# Patient Record
Sex: Female | Born: 1975 | Race: White | Hispanic: No | Marital: Married | Smoking: Former smoker
Health system: Southern US, Community
[De-identification: ages and names within clinical notes are randomized; demographics above are authoritative.]

## PROBLEM LIST (undated history)

## (undated) DIAGNOSIS — J302 Other seasonal allergic rhinitis: Secondary | ICD-10-CM

## (undated) DIAGNOSIS — M7989 Other specified soft tissue disorders: Secondary | ICD-10-CM

## (undated) DIAGNOSIS — G43909 Migraine, unspecified, not intractable, without status migrainosus: Secondary | ICD-10-CM

## (undated) DIAGNOSIS — R002 Palpitations: Secondary | ICD-10-CM

## (undated) HISTORY — DX: Other specified soft tissue disorders: M79.89

## (undated) HISTORY — DX: Other seasonal allergic rhinitis: J30.2

## (undated) HISTORY — PX: CHOLECYSTECTOMY: SHX55

## (undated) HISTORY — DX: Palpitations: R00.2

---

## 1998-03-05 ENCOUNTER — Other Ambulatory Visit: Admission: RE | Admit: 1998-03-05 | Discharge: 1998-03-05 | Payer: Self-pay | Admitting: Obstetrics and Gynecology

## 1999-03-13 ENCOUNTER — Other Ambulatory Visit: Admission: RE | Admit: 1999-03-13 | Discharge: 1999-03-13 | Payer: Self-pay | Admitting: Obstetrics and Gynecology

## 2000-04-12 ENCOUNTER — Other Ambulatory Visit: Admission: RE | Admit: 2000-04-12 | Discharge: 2000-04-12 | Payer: Self-pay | Admitting: Obstetrics and Gynecology

## 2000-07-05 ENCOUNTER — Other Ambulatory Visit: Admission: RE | Admit: 2000-07-05 | Discharge: 2000-07-05 | Payer: Self-pay | Admitting: Obstetrics and Gynecology

## 2001-02-23 ENCOUNTER — Other Ambulatory Visit: Admission: RE | Admit: 2001-02-23 | Discharge: 2001-02-23 | Payer: Self-pay | Admitting: *Deleted

## 2001-06-16 ENCOUNTER — Other Ambulatory Visit: Admission: RE | Admit: 2001-06-16 | Discharge: 2001-06-16 | Payer: Self-pay | Admitting: Obstetrics and Gynecology

## 2002-07-11 ENCOUNTER — Other Ambulatory Visit: Admission: RE | Admit: 2002-07-11 | Discharge: 2002-07-11 | Payer: Self-pay | Admitting: Obstetrics and Gynecology

## 2002-11-27 ENCOUNTER — Encounter: Payer: Self-pay | Admitting: Emergency Medicine

## 2002-11-27 ENCOUNTER — Ambulatory Visit (HOSPITAL_COMMUNITY): Admission: RE | Admit: 2002-11-27 | Discharge: 2002-11-27 | Payer: Self-pay | Admitting: Emergency Medicine

## 2004-03-24 ENCOUNTER — Other Ambulatory Visit: Admission: RE | Admit: 2004-03-24 | Discharge: 2004-03-24 | Payer: Self-pay | Admitting: Obstetrics and Gynecology

## 2004-04-21 ENCOUNTER — Inpatient Hospital Stay (HOSPITAL_COMMUNITY): Admission: AD | Admit: 2004-04-21 | Discharge: 2004-04-21 | Payer: Self-pay | Admitting: *Deleted

## 2004-09-20 ENCOUNTER — Inpatient Hospital Stay (HOSPITAL_COMMUNITY): Admission: AD | Admit: 2004-09-20 | Discharge: 2004-09-25 | Payer: Self-pay | Admitting: Obstetrics and Gynecology

## 2004-09-26 ENCOUNTER — Encounter: Admission: RE | Admit: 2004-09-26 | Discharge: 2004-10-26 | Payer: Self-pay | Admitting: Obstetrics and Gynecology

## 2004-10-27 ENCOUNTER — Other Ambulatory Visit: Admission: RE | Admit: 2004-10-27 | Discharge: 2004-10-27 | Payer: Self-pay | Admitting: Obstetrics and Gynecology

## 2004-10-28 ENCOUNTER — Emergency Department (HOSPITAL_COMMUNITY): Admission: EM | Admit: 2004-10-28 | Discharge: 2004-10-28 | Payer: Self-pay | Admitting: Emergency Medicine

## 2004-11-05 ENCOUNTER — Inpatient Hospital Stay (HOSPITAL_COMMUNITY): Admission: RE | Admit: 2004-11-05 | Discharge: 2004-11-08 | Payer: Self-pay | Admitting: Surgery

## 2006-01-27 ENCOUNTER — Other Ambulatory Visit: Admission: RE | Admit: 2006-01-27 | Discharge: 2006-01-27 | Payer: Self-pay | Admitting: Obstetrics and Gynecology

## 2006-05-12 ENCOUNTER — Encounter: Admission: RE | Admit: 2006-05-12 | Discharge: 2006-05-12 | Payer: Self-pay | Admitting: Otolaryngology

## 2012-05-10 ENCOUNTER — Emergency Department (HOSPITAL_BASED_OUTPATIENT_CLINIC_OR_DEPARTMENT_OTHER)
Admission: EM | Admit: 2012-05-10 | Discharge: 2012-05-10 | Disposition: A | Discharge status: Home / Self Care | Payer: BC Managed Care – PPO | Attending: Emergency Medicine | Admitting: Emergency Medicine

## 2012-05-10 ENCOUNTER — Encounter (HOSPITAL_BASED_OUTPATIENT_CLINIC_OR_DEPARTMENT_OTHER): Payer: Self-pay

## 2012-05-10 DIAGNOSIS — R5383 Other fatigue: Secondary | ICD-10-CM | POA: Insufficient documentation

## 2012-05-10 DIAGNOSIS — R55 Syncope and collapse: Secondary | ICD-10-CM | POA: Insufficient documentation

## 2012-05-10 DIAGNOSIS — F17200 Nicotine dependence, unspecified, uncomplicated: Secondary | ICD-10-CM | POA: Insufficient documentation

## 2012-05-10 DIAGNOSIS — R531 Weakness: Secondary | ICD-10-CM

## 2012-05-10 DIAGNOSIS — R5381 Other malaise: Secondary | ICD-10-CM | POA: Insufficient documentation

## 2012-05-10 LAB — COMPREHENSIVE METABOLIC PANEL
ALT: 8 U/L (ref 0–35)
AST: 13 U/L (ref 0–37)
Alkaline Phosphatase: 65 U/L (ref 39–117)
CO2: 27 mEq/L (ref 19–32)
GFR calc Af Amer: >90 mL/min (ref >90)
GFR calc non Af Amer: >90 mL/min (ref >90)
Glucose, Bld: 88 mg/dL (ref 70–99)
Potassium: 3.8 mEq/L (ref 3.5–5.1)
Sodium: 139 mEq/L (ref 135–145)
Total Protein: 7.1 g/dL (ref 6.0–8.3)

## 2012-05-10 LAB — CBC
Platelets: 295 10*3/uL (ref 150–400)
RBC: 4.51 MIL/uL (ref 3.87–5.11)
WBC: 7.6 10*3/uL (ref 4.0–10.5)

## 2012-05-10 LAB — DIFFERENTIAL
Basophils Absolute: 0 10*3/uL (ref 0.0–0.1)
Lymphocytes Relative: 25 % (ref 12–46)
Lymphs Abs: 1.9 10*3/uL (ref 0.7–4.0)
Neutrophils Relative %: 67 % (ref 43–77)

## 2012-05-10 LAB — MONONUCLEOSIS SCREEN: Mono Screen: NEGATIVE

## 2012-05-10 LAB — URINALYSIS, ROUTINE W REFLEX MICROSCOPIC
Leukocytes, UA: NEGATIVE
Nitrite: NEGATIVE
Specific Gravity, Urine: 1.006 (ref 1.005–1.030)
pH: 7.5 (ref 5.0–8.0)

## 2012-05-10 LAB — PREGNANCY, URINE: Preg Test, Ur: NEGATIVE

## 2012-05-10 NOTE — Discharge Instructions (Signed)
Fatigue Fatigue is a feeling of tiredness, lack of energy, lack of motivation, or feeling tired all the time. Having enough rest, good nutrition, and reducing stress will normally reduce fatigue. Consult your caregiver if it persists. The nature of your fatigue will help your caregiver to find out its cause. The treatment is based on the cause.  CAUSES  There are many causes for fatigue. Most of the time, fatigue can be traced to one or more of your habits or routines. Most causes fit into one or more of three general areas. They are: Lifestyle problems  Sleep disturbances.   Overwork.   Physical exertion.   Unhealthy habits.   Poor eating habits or eating disorders.   Alcohol and/or drug use .   Lack of proper nutrition (malnutrition).  Psychological problems  Stress and/or anxiety problems.   Depression.   Grief.   Boredom.  Medical Problems or Conditions  Anemia.   Pregnancy.   Thyroid gland problems.   Recovery from major surgery.   Continuous pain.   Emphysema or asthma that is not well controlled   Allergic conditions.   Diabetes.   Infections (such as mononucleosis).   Obesity.   Sleep disorders, such as sleep apnea.   Heart failure or other heart-related problems.   Cancer.   Kidney disease.   Liver disease.   Effects of certain medicines such as antihistamines, cough and cold remedies, prescription pain medicines, heart and blood pressure medicines, drugs used for treatment of cancer, and some antidepressants.  SYMPTOMS  The symptoms of fatigue include:   Lack of energy.   Lack of drive (motivation).   Drowsiness.   Feeling of indifference to the surroundings.  DIAGNOSIS  The details of how you feel help guide your caregiver in finding out what is causing the fatigue. You will be asked about your present and past health condition. It is important to review all medicines that you take, including prescription and non-prescription items. A  thorough exam will be done. You will be questioned about your feelings, habits, and normal lifestyle. Your caregiver may suggest blood tests, urine tests, or other tests to look for common medical causes of fatigue.  TREATMENT  Fatigue is treated by correcting the underlying cause. For example, if you have continuous pain or depression, treating these causes will improve how you feel. Similarly, adjusting the dose of certain medicines will help in reducing fatigue.  HOME CARE INSTRUCTIONS   Try to get the required amount of good sleep every night.   Eat a healthy and nutritious diet, and drink enough water throughout the day.   Practice ways of relaxing (including yoga or meditation).   Exercise regularly.   Make plans to change situations that cause stress. Act on those plans so that stresses decrease over time. Keep your work and personal routine reasonable.   Avoid street drugs and minimize use of alcohol.   Start taking a daily multivitamin after consulting your caregiver.  SEEK MEDICAL CARE IF:   You have persistent tiredness, which cannot be accounted for.   You have fever.   You have unintentional weight loss.   You have headaches.   You have disturbed sleep throughout the night.   You are feeling sad.   You have constipation.   You have dry skin.   You have gained weight.   You are taking any new or different medicines that you suspect are causing fatigue.   You are unable to sleep at night.     You develop any unusual swelling of your legs or other parts of your body.  SEEK IMMEDIATE MEDICAL CARE IF:   You are feeling confused.   Your vision is blurred.   You feel faint or pass out.   You develop severe headache.   You develop severe abdominal, pelvic, or back pain.   You develop chest pain, shortness of breath, or an irregular or fast heartbeat.   You are unable to pass a normal amount of urine.   You develop abnormal bleeding such as bleeding from  the rectum or you vomit blood.   You have thoughts about harming yourself or committing suicide.   You are worried that you might harm someone else.  MAKE SURE YOU:   Understand these instructions.   Will watch your condition.   Will get help right away if you are not doing well or get worse.  Document Released: 10/03/2007 Document Revised: 11/25/2011 Document Reviewed: 10/03/2007 [facility] Patient Information 2012 [place], [place].Near-Syncope Near-syncope is sudden weakness, dizziness, or feeling like you might pass out (faint). This can happen when getting up or while standing for a long time. It can be caused by a drop in blood pressure. It is common in people taking medicine for blood pressure. Fainting can happen when the blood pressure or pulse is too low. HOME CARE  If you feel like you are going to pass out:   Lie down right away.   Breathe deeply and steadily.   Move only when the feeling has gone away. Most of the time, this feeling lasts only a few minutes. You may feel tired for several hours.   Drink enough fluids to keep your pee (urine) clear or pale yellow.   If you are taking blood pressure or heart medicine, stand up slowly.  GET HELP RIGHT AWAY IF:   You have a severe headache.   Unusual pain develops in the chest, belly (abdomen), or back.   You have bleeding from the mouth or butt (rectum), or you have black or tarry poop (stool).   You feel your heart beat differently than normal, or you have a very fast pulse.   You pass out, or you twitch and shake when you pass out.   You pass out when sitting or lying down.   You feel confused.   You have trouble walking.   You are weak.   You have vision problems.  MAKE SURE YOU:   Understand these instructions.   Will watch your condition.   Will get help right away if you are not doing well or get worse.  Document Released: 05/24/2008 Document Revised: 11/25/2011 Document Reviewed:  01/22/2011 [facility] Patient Information 2012 [place], [place].

## 2012-05-10 NOTE — ED Notes (Signed)
Pt states she has had a sore throat since mid April-completed amoxil and is on cefdinir-concerned about episode 4/19 after eating lunch where she felt weak, tired "could just barely go"-episode yesterday at work after lunch where she stood from desk and had ? Syncopal episode-woke on floor-states her co-workers told her she looked jaundiced-c/o right flank pain 1/10-also c/o with mid abd cramps "for months" and craving salt-pt NAD at present

## 2012-05-10 NOTE — ED Provider Notes (Signed)
History     CSN: [id number]  Arrival date & time 05/10/12  1047   First MD Initiated Contact with Patient 05/10/12 1157      Chief Complaint  Patient presents with  . Fatigue    (Consider location/radiation/quality/duration/timing/severity/associated sxs/prior treatment) HPI Comments: Patient states weakness since last month.  Had viral illness she was treated for but never quite seemed to get over.  She became weak and near-syncopal at work today when standing.  Several co-workers remarked that she looked "pale".    Patient is a 36 y.o. female presenting with weakness. The history is provided by the patient.  Weakness Primary symptoms do not include fever, nausea or vomiting. Episode onset: one month ago. The symptoms are worsening. The neurological symptoms are diffuse.  Additional symptoms include weakness.    History reviewed. No pertinent past medical history.  Past Surgical History  Procedure Date  . Cholecystectomy   . Cesarean section     No family history on file.  History  Substance Use Topics  . Smoking status: Current Everyday Smoker  . Smokeless tobacco: Not on file  . Alcohol Use: Yes    OB History    Grav Para Term Preterm Abortions TAB SAB Ect Mult Living                  Review of Systems  Constitutional: Negative for fever.  Gastrointestinal: Negative for nausea and vomiting.  Neurological: Positive for weakness.  All other systems reviewed and are negative.    Allergies  Review of patient's allergies indicates no known allergies.  Home Medications  No current outpatient prescriptions on file.  BP 119/64  Pulse 78  Temp(Src) 98 F (36.7 C) (Oral)  Resp 14  Ht 5\' 3"  (1.6 m)  Wt 168 lb (76.204 kg)  BMI 29.76 kg/m2  SpO2 100%  LMP 04/22/2012  Physical Exam  Nursing note and vitals reviewed. Constitutional: She is oriented to person, place, and time. She appears well-developed and well-nourished. No distress.  HENT:  Head:  Normocephalic and atraumatic.  Neck: Normal range of motion. Neck supple.  Cardiovascular: Normal rate and regular rhythm.  Exam reveals no gallop and no friction rub.   No murmur heard. Pulmonary/Chest: Effort normal and breath sounds normal. No respiratory distress. She has no wheezes.  Abdominal: Soft. Bowel sounds are normal. She exhibits no distension. There is no tenderness.  Musculoskeletal: Normal range of motion.  Neurological: She is alert and oriented to person, place, and time.  Skin: Skin is warm and dry. She is not diaphoretic.    ED Course  Procedures (including critical care time)   Labs Reviewed  URINALYSIS, ROUTINE W REFLEX MICROSCOPIC  PREGNANCY, URINE   No results found.   No diagnosis found.   Date: 05/10/2012  Rate: 67  Rhythm: normal sinus rhythm  QRS Axis: normal  Intervals: normal  ST/T Wave abnormalities: normal  Conduction Disutrbances:none  Narrative Interpretation:   Old EKG Reviewed: unchanged    MDM  The ekg, labs to this point look okay.  The tsh and ebv are pending and won't be complete for another day or two.  The patient will be discharged to home, to return prn.  I am uncertain as to why she is feeling this way, but no emergent cause has been found.  Possibly viral.        Geoffery Lyons, MD 05/10/12 1325

## 2012-05-11 LAB — EPSTEIN-BARR VIRUS VCA ANTIBODY PANEL: EBV NA IgG: 0.21 U/mL

## 2012-06-02 ENCOUNTER — Encounter (HOSPITAL_BASED_OUTPATIENT_CLINIC_OR_DEPARTMENT_OTHER): Payer: Self-pay

## 2012-06-02 ENCOUNTER — Emergency Department (HOSPITAL_BASED_OUTPATIENT_CLINIC_OR_DEPARTMENT_OTHER)
Admission: EM | Admit: 2012-06-02 | Discharge: 2012-06-02 | Disposition: A | Discharge status: Home / Self Care | Payer: BC Managed Care – PPO | Attending: Emergency Medicine | Admitting: Emergency Medicine

## 2012-06-02 ENCOUNTER — Emergency Department (HOSPITAL_BASED_OUTPATIENT_CLINIC_OR_DEPARTMENT_OTHER): Payer: BC Managed Care – PPO

## 2012-06-02 DIAGNOSIS — S161XXA Strain of muscle, fascia and tendon at neck level, initial encounter: Secondary | ICD-10-CM

## 2012-06-02 DIAGNOSIS — Y92410 Unspecified street and highway as the place of occurrence of the external cause: Secondary | ICD-10-CM | POA: Insufficient documentation

## 2012-06-02 DIAGNOSIS — S139XXA Sprain of joints and ligaments of unspecified parts of neck, initial encounter: Secondary | ICD-10-CM | POA: Insufficient documentation

## 2012-06-02 DIAGNOSIS — S20219A Contusion of unspecified front wall of thorax, initial encounter: Secondary | ICD-10-CM

## 2012-06-02 LAB — URINALYSIS, ROUTINE W REFLEX MICROSCOPIC
Glucose, UA: NEGATIVE mg/dL
Ketones, ur: NEGATIVE mg/dL
Leukocytes, UA: NEGATIVE
Nitrite: NEGATIVE
Protein, ur: NEGATIVE mg/dL

## 2012-06-02 LAB — PREGNANCY, URINE: Preg Test, Ur: NEGATIVE

## 2012-06-02 MED ORDER — OXYCODONE-ACETAMINOPHEN 5-325 MG PO TABS
1.0000 | ORAL_TABLET | Freq: Once | ORAL | Status: AC
  Administered 2012-06-02: 1 via ORAL
  Filled 2012-06-02: qty 1

## 2012-06-02 MED ORDER — OXYCODONE-ACETAMINOPHEN 5-325 MG PO TABS: 1.0000 | ORAL_TABLET | ORAL | Status: AC | PRN
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
## 2012-06-02 NOTE — ED Notes (Signed)
MD at bedside. 
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
## 2014-01-11 ENCOUNTER — Ambulatory Visit (INDEPENDENT_AMBULATORY_CARE_PROVIDER_SITE_OTHER): Payer: BC Managed Care – PPO | Admitting: Podiatrist

## 2014-01-11 ENCOUNTER — Encounter: Payer: Self-pay | Admitting: Podiatrist

## 2014-01-11 VITALS — BP 133/82 | HR 83 | Resp 18

## 2014-01-11 DIAGNOSIS — B351 Tinea unguium: Secondary | ICD-10-CM

## 2014-01-11 MED ORDER — TERBINAFINE HCL 250 MG PO TABS: 250.0000 mg | ORAL_TABLET | Freq: Every day | ORAL | Status: AC

## 2014-01-11 NOTE — Patient Instructions (Addendum)
Lamisil (terbinifine) has been called into your pharmacy.    Terbinafine tablets What is this medicine? TERBINAFINE (TER bin a feen) is an antifungal medicine. It is used to treat certain kinds of fungal or yeast infections. This medicine may be used for other purposes; ask your health care provider or pharmacist if you have questions. COMMON BRAND NAME(S): Lamisil, Terbinex What should I tell my health care provider before I take this medicine? They need to know if you have any of these conditions: -drink alcoholic beverages -kidney disease -liver disease -an unusual or allergic reaction to terbinafine, other medicines, foods, dyes, or preservatives -pregnant or trying to get pregnant -breast-feeding How should I use this medicine? Take this medicine by mouth with a full glass of water. Follow the directions on the prescription label. You can take this medicine with food or on an empty stomach. Take your medicine at regular intervals. Do not take your medicine more often than directed. Do not skip doses or stop your medicine early even if you feel better. Do not stop taking except on your doctor's advice. Talk to your pediatrician regarding the use of this medicine in children. Special care may be needed. Overdosage: If you think you have taken too much of this medicine contact a poison control center or emergency room at once. NOTE: This medicine is only for you. Do not share this medicine with others. What if I miss a dose? If you miss a dose, take it as soon as you can. If it is almost time for your next dose, take only that dose. Do not take double or extra doses. What may interact with this medicine? Do not take this medicine with any of the following medications: -thioridazine This medicine may also interact with the following medications: -beta-blockers -caffeine -cimetidine -cyclosporine -medicines for depression, anxiety, or psychotic disturbances -medicines for fungal  infections like fluconazole and ketoconazole -medicines for irregular heartbeat like amiodarone, flecainide and propafenone -rifampin -warfarin This list may not describe all possible interactions. Give your health care provider a list of all the medicines, herbs, non-prescription drugs, or dietary supplements you use. Also tell them if you smoke, drink alcohol, or use illegal drugs. Some items may interact with your medicine. What should I watch for while using this medicine? Visit your doctor or health care provider regularly. Tell your doctor right away if you have nausea or vomiting, loss of appetite, stomach pain on your right upper side, yellow skin, dark urine, light stools, or are over tired. Some fungal infections need many weeks or months of treatment to cure. If you are taking this medicine for a long time, you will need to have important blood work done. What side effects may I notice from receiving this medicine? Side effects that you should report to your doctor or health care professional as soon as possible: -allergic reactions like skin rash or hives, swelling of the face, lips, or tongue -changes in vision -dark urine -fever or infection -general ill feeling or flu-like symptoms -light-colored stools -loss of appetite, nausea -redness, blistering, peeling or loosening of the skin, including inside the mouth -right upper belly pain -unusually weak or tired -yellowing of the eyes or skin Side effects that usually do not require medical attention (report to your doctor or health care professional if they continue or are bothersome): -changes in taste -diarrhea -hair loss -muscle or joint pain -stomach gas -stomach upset This list may not describe all possible side effects. Call your doctor for medical advice  about side effects. You may report side effects to FDA at 1-800-FDA-1088. Where should I keep my medicine? Keep out of the reach of children. Store at room  temperature below 25 degrees C (77 degrees F). Protect from light. Throw away any unused medicine after the expiration date. NOTE: This sheet is a summary. It may not cover all possible information. If you have questions about this medicine, talk to your doctor, pharmacist, or health care provider.  2014, [employer]. (2008-02-16 16:28:07)

## 2014-01-11 NOTE — Progress Notes (Signed)
   Subjective:    Patient ID: Mariah ChandlerAngelia R Newman, female    DOB: 07/24/1976, 38 y.o.   MRN: [id number]  HPI patient presents today for followup of right hallux nail. She states she was in July of 2014 and put on Lamisil for one more month to extend the medication. She's been on a total of 4 months of Lamisil medication since last summer. She's been off medication for 6 months duration and states to all toenails cleared except for the right hallux nail. She relates no problems with the Lamisil medication and no side effects are noted. Her liver function tests have always been excellent. She presents today with acrylic on the nail however she states she notices it yellow and discolored beneath the acrylic.   Review of Systems  Constitutional: Negative.   HENT: Negative.   Eyes: Negative.   Respiratory: Negative.   Cardiovascular: Negative.   Gastrointestinal: Negative.   Endocrine: Negative.   Genitourinary: Negative.   Musculoskeletal: Negative.   Skin: Negative.   Allergic/Immunologic: Positive for environmental allergies.  Neurological: Negative.   Hematological: Negative.   Psychiatric/Behavioral: Negative.        Objective:   Physical Exam Neurovascular status is intact to bilateral feet. Dermatological examination reveals Right hallux still has yellowish discoloration.  Difficult to see extent of infection with the acrylic nail but it does appear to be fungal through the acrylics the remainder of the toenails appear to be within normal limits.     Assessment & Plan:  Fungal toenail x1  Plan: Recommended repeating the Lamisil for 3 months more duration. She's always done well with this medication it appears to have worked well the other toenails. A prescription was called into her pharmacy. She'll be seen back if the nail doesn't clear otherwise she'll be seen back as needed.

## 2015-02-06 ENCOUNTER — Other Ambulatory Visit: Payer: Self-pay | Admitting: Obstetrics and Gynecology

## 2015-02-07 LAB — CYTOLOGY - PAP

## 2015-08-25 NOTE — Progress Notes (Signed)
Patient ID: Mariah Newman, female   DOB: 14-Sep-1976, 39 y.o.   MRN: [id number]      Cardiology Office Note   Date:  08/25/2015   ID:  Mariah Newman, DOB 1976/05/15, MRN [id number]  PCP:  No primary care provider on file.  Cardiologist:   Charlton Haws, MD   No chief complaint on file.     History of Present Illness: Mariah Newman is a 39 y.o. female who presents for evaluation of palpitations Usually at night.  Feels chest pounding.  Some chest pain with it and dyspnea.  Not during day or with exertion.  Been going on since January.  Mom diagnosed with breast CA then.  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  She is divorced and is in a new relationship as well as raising her 39 yo girl.  Admits to some anxiety. No history of cardiac issues Reviewed labs from Dr Hyman Hopes 7/19 and all normal including TSH  Has a "stimulant" powder in am but palpitations not bad then.  Occasional ETOH weekends but not excessive.  No high risk family history Palpitations can wake her from sleep as well    No past medical history on file.  No past surgical history on file.   No current outpatient prescriptions on file.   No current facility-administered medications for this visit.    Allergies:   Review of patient's allergies indicates not on file.    Social History:  The patient     Family History:  The patient's family history is not on file.    ROS:  Please see the history of present illness.   Otherwise, review of systems are positive for none.   All other systems are reviewed and negative.    PHYSICAL EXAM: VS:  There were no vitals taken for this visit. , BMI There is no height or weight on file to calculate BMI. Affect appropriate Healthy:  appears stated age HEENT: normal Neck supple with no adenopathy JVP normal no bruits no thyromegaly Lungs clear with no wheezing and good diaphragmatic motion Heart:  S1/S2 no murmur, no rub, gallop or click PMI  normal Abdomen: benighn, BS positve, no tenderness, no AAA no bruit.  No HSM or HJR Distal pulses intact with no bruits No edema Neuro non-focal Skin warm and dry No muscular weakness    EKG:  NSR normal ECG 07/08/15 Dr Marland Mcalpine office rate 71   Recent Labs: No results found for requested labs within last 365 days.    Lipid Panel No results found for: CHOL, TRIG, HDL, CHOLHDL, VLDL, LDLCALC, LDLDIRECT    Wt Readings from Last 3 Encounters:  No data found for Wt      Other studies Reviewed: Additional studies/ records that were reviewed today include: Epic records [employer] office notes and labs .    ASSESSMENT AND PLAN:  1.  Palpitations:  Likely related to anxiety.  Repetitive at night Event monitor  PRN inderal 2. Dyspnea:  Likely functional normal ECG and exam Echo 3. Chest Pain:  Atypical related to palpitations ETT 4. Anxiety:  F/u primary with multiple new stressors including new relationship, mom's cancer and work suspect somatizations of anxiety causing symptoms  F/U with me after tests    Current medicines are reviewed at length with the patient today.  The patient does not have concerns regarding medicines.  The following changes have been made:  PRN inderal  Labs/ tests ordered today include:  ETT, Echo Event monitor   No orders of the defined types were placed in this encounter.     Disposition:   FU with next available post test      Signed, Charlton Haws, MD  08/25/2015 3:10 PM    [facility] [address] [place], [place], [place]  [address] Phone: [phone]; Fax: [phone]

## 2015-08-26 ENCOUNTER — Other Ambulatory Visit: Payer: Self-pay

## 2015-08-27 ENCOUNTER — Encounter: Payer: Self-pay | Admitting: Cardiovascular Disease

## 2015-08-27 ENCOUNTER — Ambulatory Visit (INDEPENDENT_AMBULATORY_CARE_PROVIDER_SITE_OTHER): Payer: BLUE CROSS/BLUE SHIELD | Admitting: Cardiovascular Disease

## 2015-08-27 VITALS — BP 100/70 | HR 77 | Ht 63.0 in | Wt 174.4 lb

## 2015-08-27 DIAGNOSIS — R002 Palpitations: Secondary | ICD-10-CM

## 2015-08-27 DIAGNOSIS — R0600 Dyspnea, unspecified: Secondary | ICD-10-CM

## 2015-08-27 DIAGNOSIS — R079 Chest pain, unspecified: Secondary | ICD-10-CM

## 2015-08-27 MED ORDER — PROPRANOLOL HCL 10 MG PO TABS: 10.0000 mg | ORAL_TABLET | Freq: Every evening | ORAL | Status: AC | PRN

## 2015-08-27 NOTE — Patient Instructions (Signed)
Medication Instructions:  START PROPANOLOL  10 MG  AT  BEDTIME AS NEEDED  Labwork: NONE  Testing/Procedures: Your physician has requested that you have an exercise tolerance test. For further information please visit [email or web address] Please also follow instruction sheet, as given.   Your physician has requested that you have an echocardiogram. Echocardiography is a painless test that uses sound waves to create images of your heart. It provides your doctor with information about the size and shape of your heart and how well your heart's chambers and valves are working. This procedure takes approximately one hour. There are no restrictions for this procedure.   Your physician has recommended that you wear an event monitor. Event monitors are medical devices that record the heart's electrical activity. Doctors most often [place] these monitors to diagnose arrhythmias. Arrhythmias are problems with the speed or rhythm of the heartbeat. The monitor is a small, portable device. You can wear one while you do your normal daily activities. This is usually used to diagnose what is causing palpitations/syncope (passing out).    Follow-Up: Your physician recommends that you schedule a follow-up appointment in:  AFTER TEST  ARE DONE WITH  DR Eden Emms   Any Other Special Instructions Will Be Listed Below (If Applicable).

## 2015-09-12 ENCOUNTER — Telehealth (HOSPITAL_COMMUNITY): Payer: Self-pay

## 2015-09-12 NOTE — Telephone Encounter (Signed)
Encounter complete. 

## 2015-09-17 ENCOUNTER — Other Ambulatory Visit (HOSPITAL_COMMUNITY): Payer: BLUE CROSS/BLUE SHIELD

## 2015-09-17 ENCOUNTER — Inpatient Hospital Stay (HOSPITAL_COMMUNITY): Admission: RE | Admit: 2015-09-17 | Payer: BLUE CROSS/BLUE SHIELD | Source: Ambulatory Visit

## 2015-10-30 ENCOUNTER — Ambulatory Visit: Payer: BLUE CROSS/BLUE SHIELD | Admitting: Cardiovascular Disease

## 2018-02-15 ENCOUNTER — Other Ambulatory Visit: Payer: Self-pay | Admitting: Obstetrics and Gynecology

## 2018-02-15 DIAGNOSIS — R928 Other abnormal and inconclusive findings on diagnostic imaging of breast: Secondary | ICD-10-CM

## 2018-02-21 ENCOUNTER — Ambulatory Visit
Admission: RE | Admit: 2018-02-21 | Discharge: 2018-02-21 | Disposition: A | Discharge status: Home / Self Care | Payer: BLUE CROSS/BLUE SHIELD | Source: Ambulatory Visit | Attending: Obstetrics and Gynecology | Admitting: Obstetrics and Gynecology

## 2018-02-21 DIAGNOSIS — R928 Other abnormal and inconclusive findings on diagnostic imaging of breast: Secondary | ICD-10-CM

## 2019-08-31 ENCOUNTER — Other Ambulatory Visit: Payer: Self-pay

## 2019-08-31 DIAGNOSIS — Z20822 Contact with and (suspected) exposure to covid-19: Secondary | ICD-10-CM

## 2019-09-02 LAB — NOVEL CORONAVIRUS, NAA: SARS-CoV-2, NAA: NOT DETECTED

## 2019-09-03 ENCOUNTER — Encounter: Payer: Self-pay | Admitting: Podiatrist

## 2021-12-01 ENCOUNTER — Emergency Department (HOSPITAL_BASED_OUTPATIENT_CLINIC_OR_DEPARTMENT_OTHER): Payer: 59

## 2021-12-01 ENCOUNTER — Other Ambulatory Visit: Payer: Self-pay

## 2021-12-01 ENCOUNTER — Emergency Department (HOSPITAL_BASED_OUTPATIENT_CLINIC_OR_DEPARTMENT_OTHER)
Admission: EM | Admit: 2021-12-01 | Discharge: 2021-12-01 | Disposition: A | Discharge status: Home / Self Care | Payer: 59 | Attending: Emergency Medicine | Admitting: Emergency Medicine

## 2021-12-01 ENCOUNTER — Encounter (HOSPITAL_BASED_OUTPATIENT_CLINIC_OR_DEPARTMENT_OTHER): Payer: Self-pay | Admitting: *Deleted

## 2021-12-01 DIAGNOSIS — G43009 Migraine without aura, not intractable, without status migrainosus: Secondary | ICD-10-CM

## 2021-12-01 DIAGNOSIS — Z87891 Personal history of nicotine dependence: Secondary | ICD-10-CM | POA: Diagnosis not present

## 2021-12-01 DIAGNOSIS — H538 Other visual disturbances: Secondary | ICD-10-CM | POA: Diagnosis not present

## 2021-12-01 DIAGNOSIS — R519 Headache, unspecified: Secondary | ICD-10-CM | POA: Diagnosis present

## 2021-12-01 DIAGNOSIS — D72829 Elevated white blood cell count, unspecified: Secondary | ICD-10-CM | POA: Insufficient documentation

## 2021-12-01 HISTORY — DX: Migraine, unspecified, not intractable, without status migrainosus: G43.909

## 2021-12-01 LAB — CBC WITH DIFFERENTIAL/PLATELET
Abs Immature Granulocytes: 0.03 10*3/uL (ref 0.00–0.07)
Basophils Absolute: 0.1 10*3/uL (ref 0.0–0.1)
Basophils Relative: 0 %
Eosinophils Absolute: 0.1 10*3/uL (ref 0.0–0.5)
Eosinophils Relative: 1 %
HCT: 42.8 % (ref 36.0–46.0)
Hemoglobin: 14.9 g/dL (ref 12.0–15.0)
Immature Granulocytes: 0 %
Lymphocytes Relative: 26 %
Lymphs Abs: 3 10*3/uL (ref 0.7–4.0)
MCH: 31.2 pg (ref 26.0–34.0)
MCHC: 34.8 g/dL (ref 30.0–36.0)
MCV: 89.7 fL (ref 80.0–100.0)
Monocytes Absolute: 0.7 10*3/uL (ref 0.1–1.0)
Monocytes Relative: 6 %
Neutro Abs: 7.5 10*3/uL (ref 1.7–7.7)
Neutrophils Relative %: 67 %
Platelets: 369 10*3/uL (ref 150–400)
RBC: 4.77 MIL/uL (ref 3.87–5.11)
RDW: 12.3 % (ref 11.5–15.5)
WBC: 11.3 10*3/uL — ABNORMAL HIGH (ref 4.0–10.5)
nRBC: 0 % (ref 0.0–0.2)

## 2021-12-01 LAB — URINALYSIS, ROUTINE W REFLEX MICROSCOPIC
Bilirubin Urine: NEGATIVE
Glucose, UA: NEGATIVE mg/dL
Ketones, ur: NEGATIVE mg/dL
Nitrite: NEGATIVE
Protein, ur: NEGATIVE mg/dL
Specific Gravity, Urine: <=1.005 (ref 1.005–1.030)
pH: 5.5 (ref 5.0–8.0)

## 2021-12-01 LAB — BASIC METABOLIC PANEL
Anion gap: 9 (ref 5–15)
BUN: 12 mg/dL (ref 6–20)
CO2: 24 mmol/L (ref 22–32)
Calcium: 9.1 mg/dL (ref 8.9–10.3)
Chloride: 103 mmol/L (ref 98–111)
Creatinine, Ser: 0.68 mg/dL (ref 0.44–1.00)
GFR, Estimated: >60 mL/min (ref >60)
Glucose, Bld: 88 mg/dL (ref 70–99)
Potassium: 4 mmol/L (ref 3.5–5.1)
Sodium: 136 mmol/L (ref 135–145)

## 2021-12-01 LAB — URINALYSIS, MICROSCOPIC (REFLEX)

## 2021-12-01 LAB — PREGNANCY, URINE: Preg Test, Ur: NEGATIVE

## 2021-12-01 MED ORDER — DIPHENHYDRAMINE HCL 50 MG/ML IJ SOLN
50.0000 mg | Freq: Once | INTRAMUSCULAR | Status: AC
  Administered 2021-12-01: 50 mg via INTRAVENOUS
  Filled 2021-12-01: qty 1

## 2021-12-01 MED ORDER — SODIUM CHLORIDE 0.9 % IV BOLUS
1000.0000 mL | Freq: Once | INTRAVENOUS | Status: AC
  Administered 2021-12-01: 1000 mL via INTRAVENOUS

## 2021-12-01 MED ORDER — METOCLOPRAMIDE HCL 5 MG/ML IJ SOLN
10.0000 mg | Freq: Once | INTRAMUSCULAR | Status: AC
  Administered 2021-12-01: 10 mg via INTRAVENOUS
  Filled 2021-12-01: qty 2

## 2021-12-01 NOTE — Discharge Instructions (Signed)
Your work-up today was reassuring, continue taking your medicine at home as prescribed.    You can take Tylenol and Motrin as needed for headache.  If you start having worsening vision changes, unilateral weakness or numbness return back to the ED for additional work-up.    Follow-up with your primary care doctor in 3 days if you are still having symptoms.  Schedule a follow-up appointment with neurology, information provided above.    Given you have not had a work-up yet for migraines think you should have this done on a nonemergent basis.

## 2021-12-01 NOTE — ED Provider Notes (Signed)
[facility] EMERGENCY DEPARTMENT Provider Note   CSN: [id number] Arrival date & time: 12/01/21  1501     History Chief Complaint  Patient presents with   Headache   Blurred Vision    Mariah Newman is a 45 y.o. female.  HPI  Patient with history of chronic migraines presents due to vision changes and headache.  Vision changes started acutely while at work, reports the left side of her vision looked blurry in both the left and the right eye.  She covered both eyes and had the same blurry vision.  This lasted for about an hour and resolved on its own.  Has not returned.  Reports she had a headache it started around the same time, its right-sided and feels more intense than her typical migraines.  Her right face felt sore, no numbness or tingling.  She took an Aleve which somewhat helped reduce the severity.    Past Medical History:  Diagnosis Date   Leg swelling    Migraines    Palpitation    Seasonal allergies     There are no problems to display for this patient.   Past Surgical History:  Procedure Laterality Date   CESAREAN SECTION     CHOLECYSTECTOMY       OB History   No obstetric history on file.     Family History  Problem Relation Age of Onset   Hypertension Father    Heart disease Paternal Aunt    Breast cancer Neg Hx     Social History   Tobacco Use   Smoking status: Former   Smokeless tobacco: Never  Substance Use Topics   Alcohol use: Yes   Drug use: No    Home Medications Prior to Admission medications   Medication Sig Start Date End Date Taking? Authorizing Provider  Biotin 5000 MCG TABS Take 1 tablet by mouth daily.    [provider]  cholecalciferol (VITAMIN D) 1000 UNITS tablet Take 1,000 Units by mouth daily.    [provider]  furosemide (LASIX) 20 MG tablet Take 20 mg by mouth as needed for fluid or edema. swelling 07/14/15   [provider]  GILDESS FE 1/20 1-20 MG-MCG tablet Take 1 tablet  by mouth daily. 08/08/15   [provider]  loratadine (CLARITIN) 10 MG tablet Take 10 mg by mouth daily.    [provider]  Multiple Vitamins-Minerals ([employer] WOMENS DAILY GUMMIES PO) Take 2 tablets by mouth daily.    [provider]  Norethin Ace-Eth Estrad-FE (LOESTRIN 24 FE PO) Take by mouth. One tablet by mouth every day    [provider]  propranolol (INDERAL) 10 MG tablet Take 1 tablet (10 mg total) by mouth at bedtime as needed. 08/27/15   Josue Hector, MD  terbinafine (LAMISIL) 250 MG tablet Take 1 tablet (250 mg total) by mouth daily. 01/11/14   Bronson Ing, DPM  valACYclovir (VALTREX) 500 MG tablet Take 500 mg by mouth daily. 07/16/15   [provider]    Allergies    Patient has no known allergies.  Review of Systems   Review of Systems  Constitutional:  Negative for fever.  HENT:  Positive for sinus pressure.   Eyes:  Positive for photophobia and visual disturbance. Negative for pain.  Respiratory:  Negative for shortness of breath.   Cardiovascular:  Negative for chest pain.  Gastrointestinal:  Positive for nausea. Negative for vomiting.  Musculoskeletal:  Negative for back pain and  neck stiffness.  Neurological:  Positive for headaches. Negative for dizziness, seizures, syncope, weakness and numbness.  Psychiatric/Behavioral:  Negative for confusion.    Physical Exam Updated Vital Signs BP (!) 152/70    Pulse 85    Temp 98.6 F (37 C) (Oral)    Resp 18    Ht 5\' 2"  (1.575 m)    Wt 102.1 kg    SpO2 100%    BMI 41.15 kg/m   Physical Exam Vitals and nursing note reviewed.  Constitutional:      General: She is not in acute distress.    Appearance: She is well-developed.  HENT:     Head: Normocephalic and atraumatic.  Eyes:     Extraocular Movements: Extraocular movements intact.     Conjunctiva/sclera: Conjunctivae normal.     Pupils: Pupils are equal, round, and reactive to light.     Comments: EOMI, no pain with  EOM.  No nystagmus   Cardiovascular:     Rate and Rhythm: Normal rate and regular rhythm.     Heart sounds: No murmur heard. Pulmonary:     Effort: Pulmonary effort is normal. No respiratory distress.     Breath sounds: Normal breath sounds.  Abdominal:     Palpations: Abdomen is soft.     Tenderness: There is no abdominal tenderness.  Musculoskeletal:        General: No swelling.     Cervical back: Normal range of motion and neck supple.  Lymphadenopathy:     Cervical: No cervical adenopathy.  Skin:    General: Skin is warm and dry.     Capillary Refill: Capillary refill takes less than 2 seconds.  Neurological:     Mental Status: She is alert and oriented to person, place, and time.     Comments: Cranial nerves III through XII are grossly intact.  Grip strength equal bilaterally, negative Romberg.  Lower extremity strength equal bilaterally, ambulatory with steady gait  Psychiatric:        Mood and Affect: Mood normal.    ED Results / Procedures / Treatments   Labs (all labs ordered are listed, but only abnormal results are displayed) Labs Reviewed  CBC WITH DIFFERENTIAL/PLATELET - Abnormal; Notable for the following components:      Result Value   WBC 11.3 (*)    All other components within normal limits  URINALYSIS, ROUTINE W REFLEX MICROSCOPIC - Abnormal; Notable for the following components:   Color, Urine STRAW (*)    APPearance HAZY (*)    Hgb urine dipstick TRACE (*)    Leukocytes,Ua TRACE (*)    All other components within normal limits  URINALYSIS, MICROSCOPIC (REFLEX) - Abnormal; Notable for the following components:   Bacteria, UA MANY (*)    All other components within normal limits  BASIC METABOLIC PANEL  PREGNANCY, URINE    EKG None  Radiology CT Head Wo Contrast  Result Date: 12/01/2021 CLINICAL DATA:  Chronic headache increased frequency EXAM: CT HEAD WITHOUT CONTRAST TECHNIQUE: Contiguous axial images were obtained from the base of the skull  through the vertex without intravenous contrast. COMPARISON:  None. FINDINGS: Brain: No evidence of acute infarction, hemorrhage, hydrocephalus, extra-axial collection or mass lesion/mass effect. Vascular: No hyperdense vessel or unexpected calcification. Skull: Normal. Negative for fracture or focal lesion. Sinuses/Orbits: No acute finding. Other: None. IMPRESSION: Normal head CT without contrast for age Electronically Signed   By: 12/03/2021.  Shick M.D.   On: 12/01/2021 15:45    Procedures Procedures  Medications Ordered in ED Medications  metoCLOPramide (REGLAN) injection 10 mg (10 mg Intravenous Given 12/01/21 1612)  diphenhydrAMINE (BENADRYL) injection 50 mg (50 mg Intravenous Given 12/01/21 1613)  sodium chloride 0.9 % bolus 1,000 mL (1,000 mLs Intravenous New Bag/Given 12/01/21 1612)    ED Course  I have reviewed the triage vital signs and the nursing notes.  Pertinent labs & imaging results that were available during my care of the patient were reviewed by me and considered in my medical decision making (see chart for details).  Clinical Course as of 12/01/21 1650  Tue Dec 01, 2021  1624 CBC with Differential(!) Slight leukocytosis without a left shift.  No anemia [HS]  123XX123 Basic metabolic panel No electrolyte derangement, no AKI. [HS]  1624 Urinalysis, Routine w reflex microscopic Urine, Clean Catch(!) No evidence of UTI. [HS]    Clinical Course User Index [HS] Sherrill Raring, PA-C   MDM Rules/Calculators/A&P                           Stable vitals, nontoxic-appearing.  No focal deficits on neuro exam, no nystagmus or pain with EOM.  Do think is reasonable to get CT head given she has never had one before and this is atypical for classic migraine.  Could be a complex migraine, TIA/stroke is a consideration but I think it is less likely given normal neurologic examination, age, no unilateral symptoms. SAH less likely given well controlled HTN.    CT head negative for intracranial  mass or intracranial bleed.  Visual acuity fully intact.  Patient reports improvement of her migraine, has not had any recurrence of symptoms.  Vitals remained stable.  Patient is a slight leukocytosis without a left shift, no anemia.  No gross electrolyte derangement.   Patient has a longstanding history of migraines but has never been evaluated by her neurologist.  I suspect her symptoms today are due to complex migraine.  I will make a referral, patient advised to follow-up with her PCP later this week for reevaluation.  Return precautions given including any unilateral weakness or numbness, vision loss, return or worsening of symptoms.  Do not think she needs additional work-up at this time, discharged in stable condition.  Final Clinical Impression(s) / ED Diagnoses Final diagnoses:  None    Rx / DC Orders ED Discharge Orders     None        Sherrill Raring, Hershal Coria 12/01/21 1650    Dorie Rank, MD 12/02/21 [phone]

## 2021-12-01 NOTE — ED Notes (Signed)
Relates HA and blurred vision more to her weekly sinus issues, than her typical migraine.

## 2021-12-01 NOTE — ED Triage Notes (Signed)
Blurred vision at work this am. Severe headache followed the blurred vision.  Hx of migraine headaches.

## 2023-07-06 ENCOUNTER — Other Ambulatory Visit: Payer: Self-pay | Admitting: Allergy

## 2023-07-06 ENCOUNTER — Ambulatory Visit
Admission: RE | Admit: 2023-07-06 | Discharge: 2023-07-06 | Disposition: A | Discharge status: Home / Self Care | Payer: BC Managed Care – PPO | Source: Ambulatory Visit | Attending: Allergy | Admitting: Allergy

## 2023-07-06 DIAGNOSIS — R052 Subacute cough: Secondary | ICD-10-CM
